# Patient Record
Sex: Male | Born: 1995 | Race: White | Hispanic: No | Marital: Single | Smoking: Never smoker
Health system: Southern US, Community
[De-identification: ages and names within clinical notes are randomized; demographics above are authoritative.]

## PROBLEM LIST (undated history)

## (undated) DIAGNOSIS — J45909 Unspecified asthma, uncomplicated: Secondary | ICD-10-CM

---
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
## 2014-04-23 ENCOUNTER — Emergency Department: Payer: Self-pay | Admitting: Student

## 2014-04-25 ENCOUNTER — Emergency Department: Payer: Self-pay | Admitting: Emergency Medicine

## 2015-10-16 ENCOUNTER — Emergency Department
Admission: EM | Admit: 2015-10-16 | Discharge: 2015-10-16 | Disposition: A | Discharge status: Home / Self Care | Payer: BC Managed Care – PPO | Attending: Emergency Medicine | Admitting: Emergency Medicine

## 2015-10-16 ENCOUNTER — Encounter: Payer: Self-pay | Admitting: Emergency Medicine

## 2015-10-16 DIAGNOSIS — J45901 Unspecified asthma with (acute) exacerbation: Secondary | ICD-10-CM | POA: Insufficient documentation

## 2015-10-16 DIAGNOSIS — J309 Allergic rhinitis, unspecified: Secondary | ICD-10-CM | POA: Diagnosis not present

## 2015-10-16 DIAGNOSIS — R05 Cough: Secondary | ICD-10-CM | POA: Diagnosis present

## 2015-10-16 HISTORY — DX: Unspecified asthma, uncomplicated: J45.909

## 2015-10-16 MED ORDER — ALBUTEROL SULFATE HFA 108 (90 BASE) MCG/ACT IN AERS: 2.0000 | INHALATION_SPRAY | RESPIRATORY_TRACT | 0 refills | Status: AC | PRN

## 2015-10-16 NOTE — ED Triage Notes (Signed)
Wheezing, Asthma attack x 45 minutes.

## 2015-10-16 NOTE — ED Notes (Signed)
NAD noted at time of D/C. Pt denies questions or concerns. Pt ambulatory to the lobby at this time.  

## 2015-10-16 NOTE — ED Provider Notes (Signed)
[facility] Emergency Department Provider Note  ____________________________________________  Time seen: Approximately 7:35 PM  I have reviewed the triage vital signs and the nursing notes.   HISTORY  Chief Complaint Wheezing    HPI Zachary Garza is a 20 y.o. male presents to emergency department complaining of an asthma flare earlier today. Patient states that he had a history of asthma as a child. His last attack was approximately 10 years ago. Patient states that he was cleaning out a shed that had not been opened and over 3-4 years. Patient reports that the best in the shed set off coughing and wheezing. Patient states that once he was out of that apartment and was breathing clean air symptoms settle down any at this time has no more symptoms. Patient does have allergic rhinitis on an ongoing basis and takes daily medications for same. Patient reports that he forgot his daily medication this morning. No other complaints at this time.   Past Medical History:  Diagnosis Date  . Asthma     There are no active problems to display for this patient.   History reviewed. No pertinent surgical history.  Prior to Admission medications   Medication Sig Start Date End Date Taking? Authorizing Provider  albuterol (PROVENTIL HFA;VENTOLIN HFA) 108 (90 Base) MCG/ACT inhaler Inhale 2 puffs into the lungs every 4 (four) hours as needed for wheezing or shortness of breath. 10/16/15   Delorise RoyalsJonathan D Miran Kautzman, PA-C    Allergies Review of patient's allergies indicates no known allergies.  No family history on file.  Social History Social History  Substance Use Topics  . Smoking status: Never Smoker  . Smokeless tobacco: Never Used  . Alcohol use No     Review of Systems  Constitutional: No fever/chills Eyes: No visual changes. No discharge ENT: No upper respiratory complaints. Cardiovascular: no chest pain. Respiratory: Positive cough. Positive wheezing. No  SOB. Gastrointestinal: No abdominal pain.  No nausea, no vomiting.   Musculoskeletal: Negative for musculoskeletal pain. Skin: Negative for rash, abrasions, lacerations, ecchymosis. Neurological: Negative for headaches, focal weakness or numbness. 10-point ROS otherwise negative.  ____________________________________________   PHYSICAL EXAM:  VITAL SIGNS: ED Triage Vitals  Enc Vitals Group     BP 10/16/15 1847 138/63     Pulse Rate 10/16/15 1847 71     Resp 10/16/15 1847 18     Temp 10/16/15 1847 98.1 F (36.7 C)     Temp Source 10/16/15 1847 Oral     SpO2 10/16/15 1846 97 %     Weight 10/16/15 1848 215 lb (97.5 kg)     Height 10/16/15 1848 5\' 11"  (1.803 m)     Head Circumference --      Peak Flow --      Pain Score --      Pain Loc --      Pain Edu? --      Excl. in GC? --      Constitutional: Alert and oriented. Well appearing and in no acute distress. Eyes: Conjunctivae are normal. PERRL. EOMI. Head: Atraumatic. ENT:      Ears:       Nose: No congestion/rhinnorhea. Turbinates are slightly boggy in appearance.      Mouth/Throat: Mucous membranes are moist.  Neck: No stridor.    Cardiovascular: Normal rate, regular rhythm. Normal S1 and S2.  Good peripheral circulation. Respiratory: Normal respiratory effort without tachypnea or retractions. Lungs CTAB. Good air entry to the bases with no decreased or absent breath  sounds. Musculoskeletal: Full range of motion to all extremities. No gross deformities appreciated. Neurologic:  Normal speech and language. No gross focal neurologic deficits are appreciated.  Skin:  Skin is warm, dry and intact. No rash noted. Psychiatric: Mood and affect are normal. Speech and behavior are normal. Patient exhibits appropriate insight and judgement.   ____________________________________________   LABS (all labs ordered are listed, but only abnormal results are displayed)  Labs Reviewed - No data to  display ____________________________________________  EKG   ____________________________________________  RADIOLOGY   No results found.  ____________________________________________    PROCEDURES  Procedure(s) performed:    Procedures    Medications - No data to display   ____________________________________________   INITIAL IMPRESSION / ASSESSMENT AND PLAN / ED COURSE  Pertinent labs & imaging results that were available during my care of the patient were reviewed by me and considered in my medical decision making (see chart for details).  Review of the [place] CSRS was performed in accordance of the [facility] prior to dispensing any controlled drugs.  Clinical Course    Patient's diagnosis is consistent with As a mild exacerbation from allergens. At this time, patient is symptom-free. Exam is reassuring with no adventitious lung sounds. Patient declines albuterol treatment in the emergency department. No indication for steroids at this time.. Patient will be discharged home with prescriptions for albuterol inhaler to has needed. Patient is to follow up with primary care provider as needed or otherwise directed. Patient is given ED precautions to return to the ED for any worsening or new symptoms.     ____________________________________________  FINAL CLINICAL IMPRESSION(S) / ED DIAGNOSES  Final diagnoses:  Asthma exacerbation  Allergic rhinitis, unspecified allergic rhinitis type      NEW MEDICATIONS STARTED DURING THIS VISIT:  New Prescriptions   ALBUTEROL (PROVENTIL HFA;VENTOLIN HFA) 108 (90 BASE) MCG/ACT INHALER    Inhale 2 puffs into the lungs every 4 (four) hours as needed for wheezing or shortness of breath.        This chart was dictated using voice recognition software/Dragon. Despite best efforts to proofread, errors can occur which can change the meaning. Any change was purely unintentional.    KONSTANTINE GERVASI, PA-C 10/16/15 1941     Emily Filbert, MD 10/16/15 [phone]

## 2016-07-21 IMAGING — CR RIGHT HAND - COMPLETE 3+ VIEW
1 series · 3 of 3 positions shown · non-contrast
Comparison: None.

CLINICAL DATA: Puncture wound palmar aspect of hand from tree
branch

EXAM:
RIGHT HAND - COMPLETE 3+ VIEW

[Series 1: pa · 0.17mm/px · 3 of 3 slices shown]
[im 1/3]
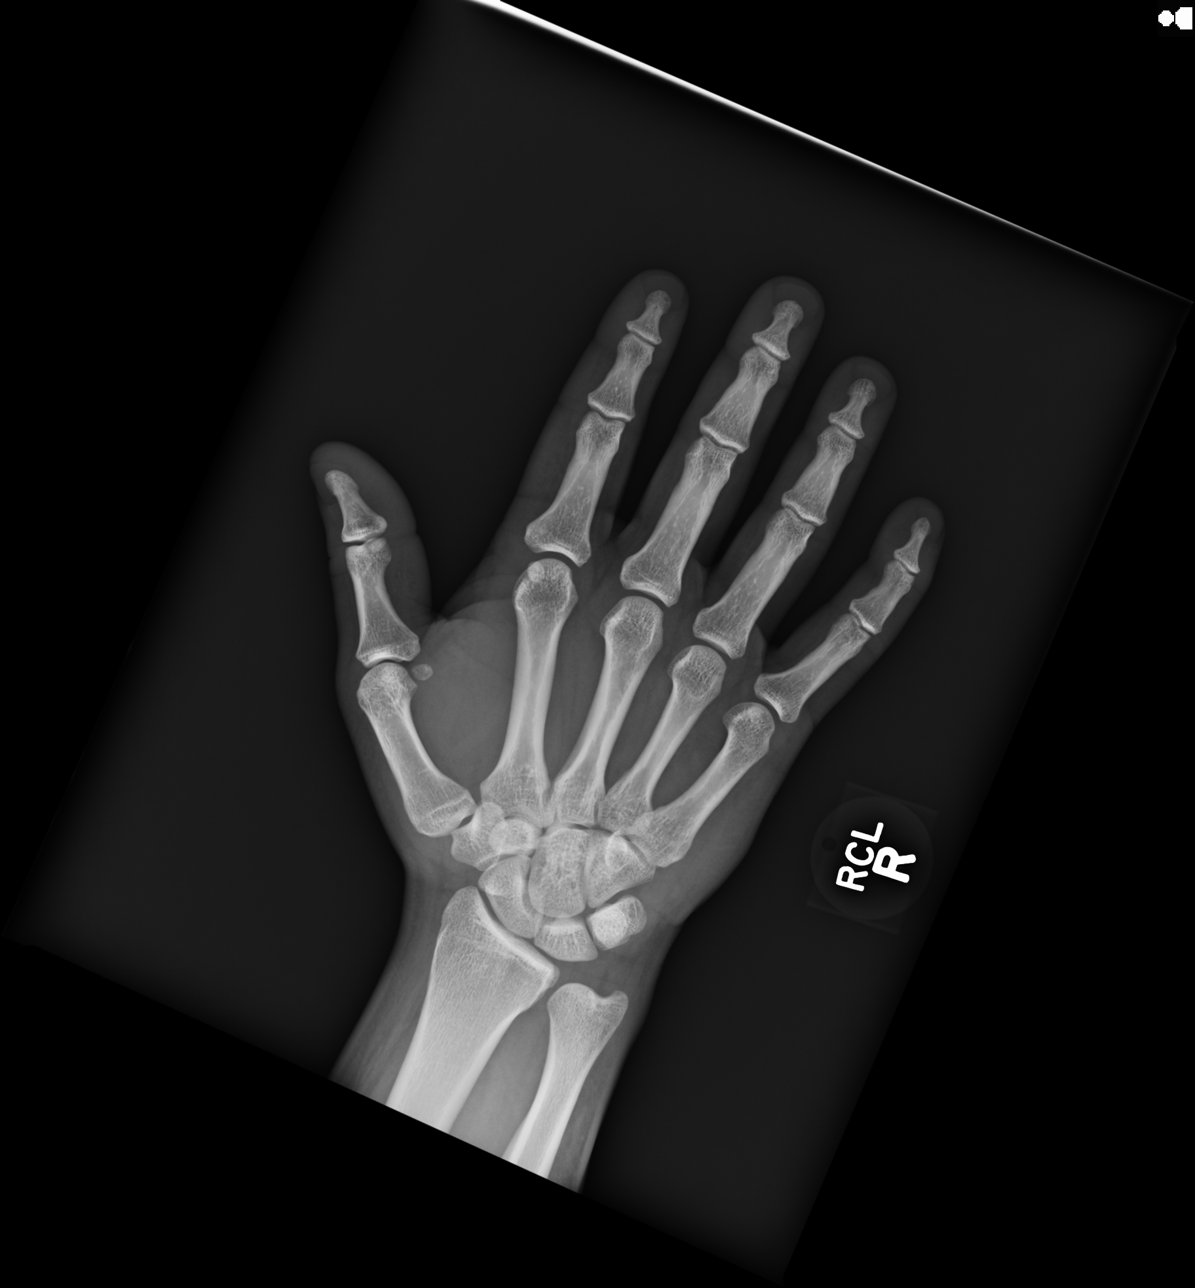
[im 2/3]
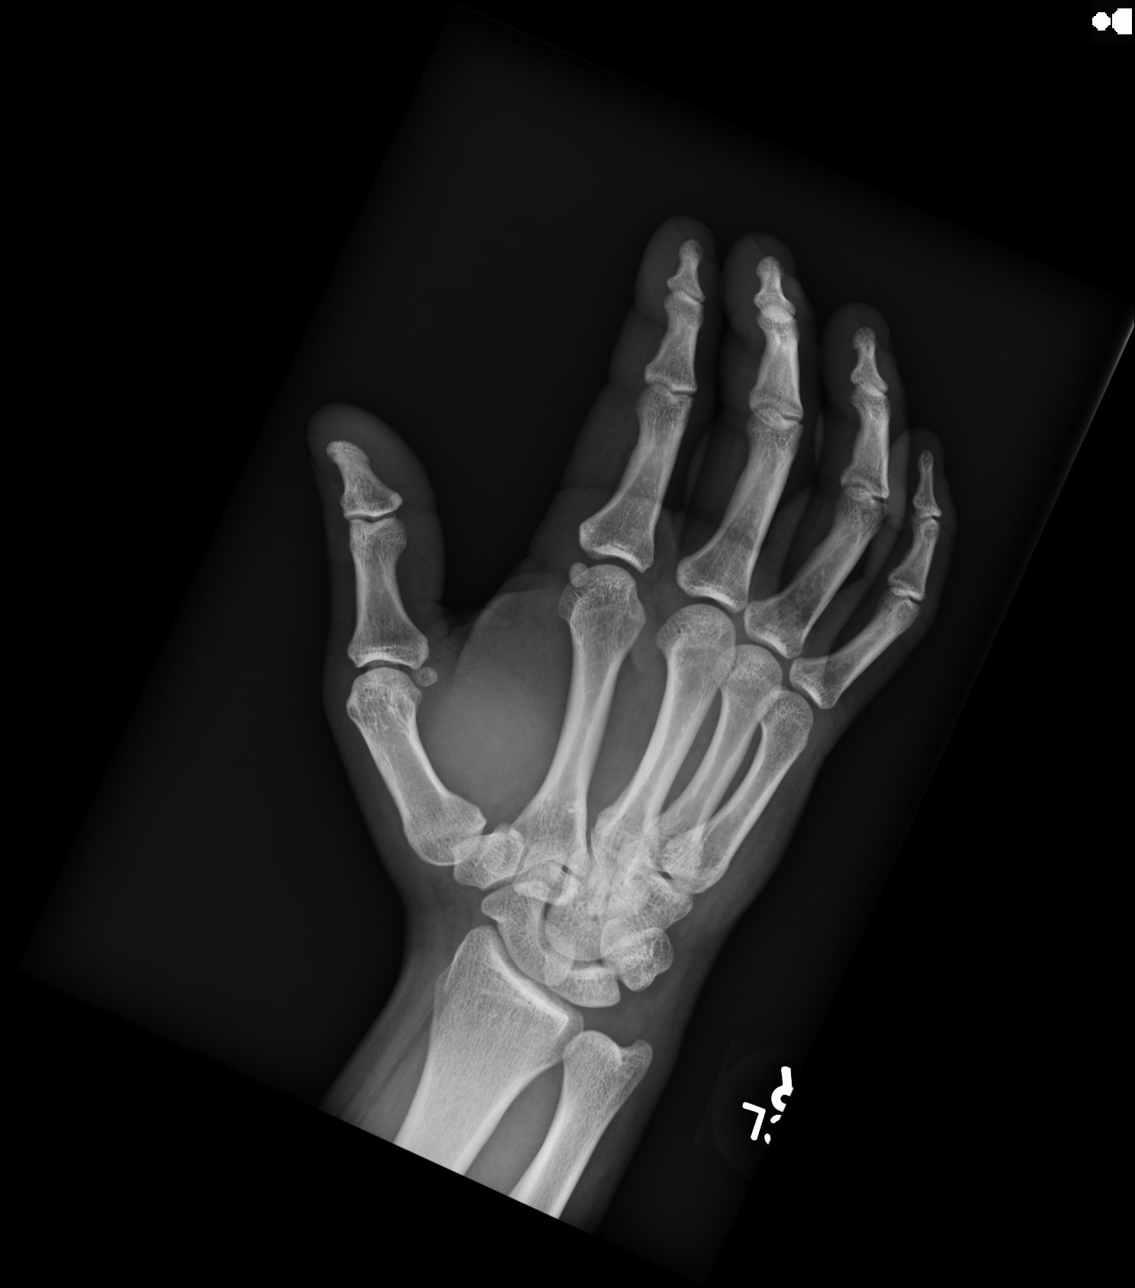
[im 3/3]
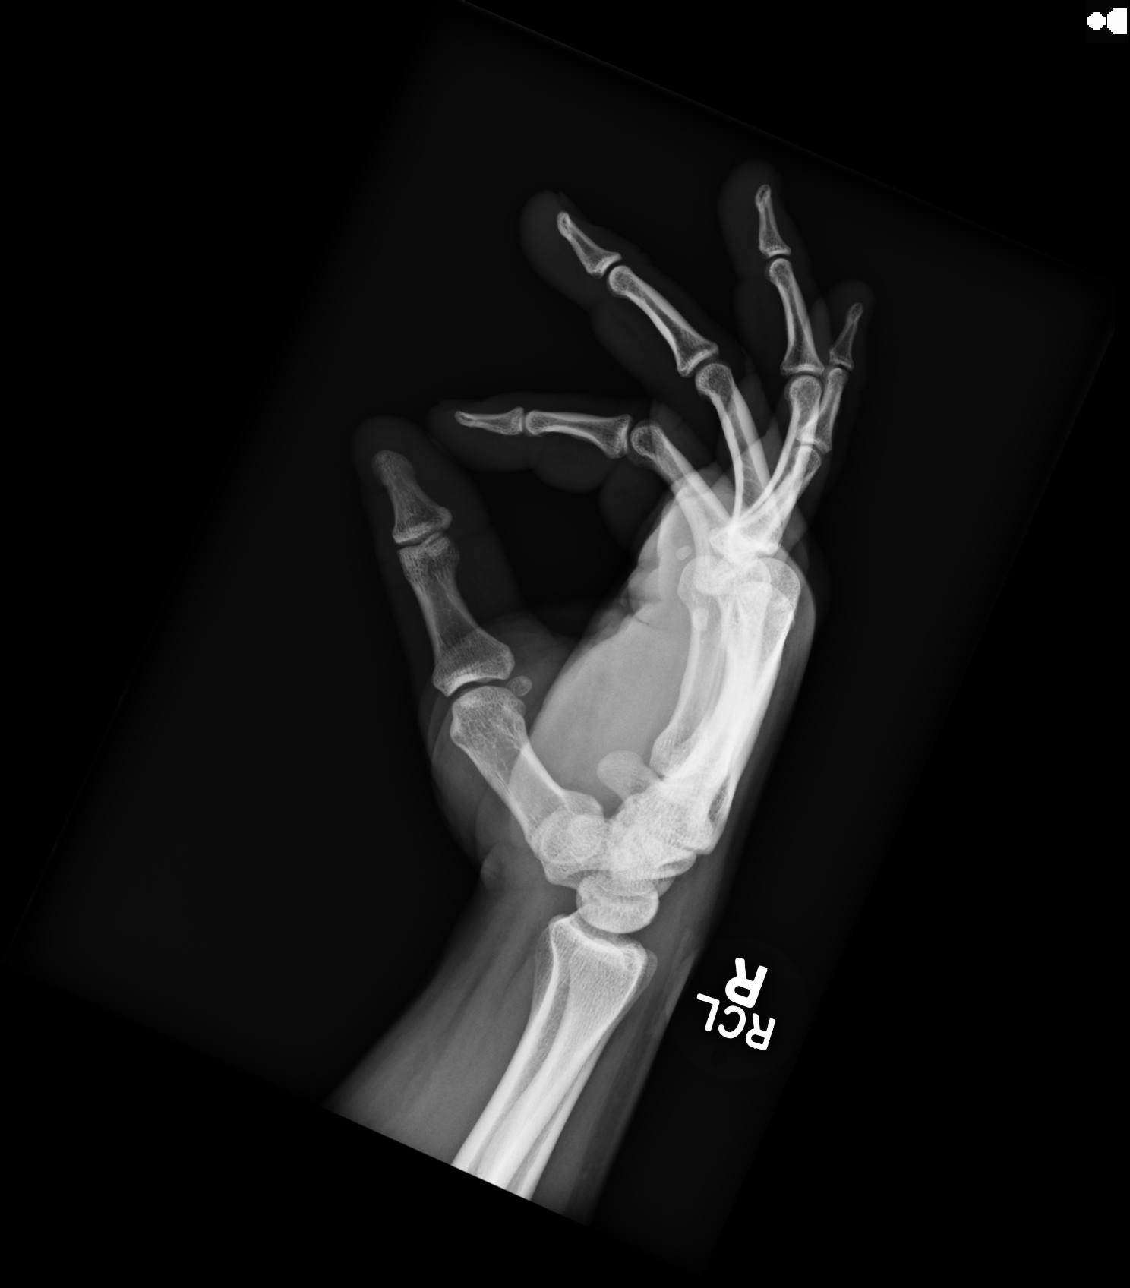

[3 of 3 positions shown; findings below may reference images not displayed]

FINDINGS: Frontal, oblique, and lateral views were obtained. There is soft
tissue swelling along the palmar aspect of the hand. No radiopaque
foreign body seen. No fracture or dislocation. Joint spaces appear
intact. No erosive change or bony destruction.
IMPRESSION: Soft tissue swelling without demonstrable radiopaque foreign body.
No bony abnormality. No appreciable arthropathy.
